# Patient Record
Sex: Female | Born: 1995 | Hispanic: No | Marital: Married | Smoking: Never smoker
Health system: Southern US, Community
[De-identification: ages and names within clinical notes are randomized; demographics above are authoritative.]

## PROBLEM LIST (undated history)

## (undated) DIAGNOSIS — J45909 Unspecified asthma, uncomplicated: Secondary | ICD-10-CM

## (undated) HISTORY — DX: Unspecified asthma, uncomplicated: J45.909

---

## 2014-08-21 ENCOUNTER — Ambulatory Visit (INDEPENDENT_AMBULATORY_CARE_PROVIDER_SITE_OTHER): Payer: Medicaid Other | Admitting: Obstetrics and Gynecology

## 2014-08-21 ENCOUNTER — Encounter: Payer: Self-pay | Admitting: Obstetrics and Gynecology

## 2014-08-21 VITALS — BP 98/80 | HR 102 | Temp 98.0°F | Ht 65.0 in | Wt 137.1 lb

## 2014-08-21 DIAGNOSIS — Z1151 Encounter for screening for human papillomavirus (HPV): Secondary | ICD-10-CM

## 2014-08-21 DIAGNOSIS — Z3492 Encounter for supervision of normal pregnancy, unspecified, second trimester: Secondary | ICD-10-CM

## 2014-08-21 DIAGNOSIS — Z124 Encounter for screening for malignant neoplasm of cervix: Secondary | ICD-10-CM

## 2014-08-21 DIAGNOSIS — Z3402 Encounter for supervision of normal first pregnancy, second trimester: Secondary | ICD-10-CM

## 2014-08-21 DIAGNOSIS — Z3400 Encounter for supervision of normal first pregnancy, unspecified trimester: Secondary | ICD-10-CM | POA: Insufficient documentation

## 2014-08-21 DIAGNOSIS — Z23 Encounter for immunization: Secondary | ICD-10-CM

## 2014-08-21 LAB — POCT URINALYSIS DIP (DEVICE)
BILIRUBIN URINE: NEGATIVE
Glucose, UA: NEGATIVE mg/dL
HGB URINE DIPSTICK: NEGATIVE
Ketones, ur: NEGATIVE mg/dL
NITRITE: NEGATIVE
PH: 7 (ref 5.0–8.0)
PROTEIN: NEGATIVE mg/dL
SPECIFIC GRAVITY, URINE: 1.02 (ref 1.005–1.030)
UROBILINOGEN UA: 0.2 mg/dL (ref 0.0–1.0)

## 2014-08-21 NOTE — Progress Notes (Signed)
Nutrition note: 1st visit consult Pt has gained 7.1# @ 21w, which is < expected. Pt reports eating 3 meals & 4-5 snacks/d. Pt is not taking a PNV yet. Pt reports no N/V or heartburn. NKFA. Pt received verbal & written education on general nutrition during pregnancy. Encouraged PNV or 2 chewable multivitamins. Encouraged protein source with all meals & snacks. Discussed wt gain goals of 25-35# or 1#/wk. Pt agrees to start taking a PNV or equivalent. Pt does not have WIC. Pt plans to BF. F/u as needed Blondell RevealLaura Freddy Spadafora, MS, RD, LDN, [facility] PaBCLC

## 2014-08-21 NOTE — Progress Notes (Signed)
Initial OB appointment

## 2014-08-21 NOTE — Patient Instructions (Signed)
Second Trimester of Pregnancy The second trimester is from week 13 through week 28, months 4 through 6. The second trimester is often a time when you feel your best. Your body has also adjusted to being pregnant, and you begin to feel better physically. Usually, morning sickness has lessened or quit completely, you may have more energy, and you may have an increase in appetite. The second trimester is also a time when the fetus is growing rapidly. At the end of the sixth month, the fetus is about 9 inches long and weighs about 1 pounds. You will likely begin to feel the baby move (quickening) between 18 and 20 weeks of the pregnancy. BODY CHANGES Your body goes through many changes during pregnancy. The changes vary from woman to woman.   Your weight will continue to increase. You will notice your lower abdomen bulging out.  You may begin to get stretch marks on your hips, abdomen, and breasts.  You may develop headaches that can be relieved by medicines approved by your health care provider.  You may urinate more often because the fetus is pressing on your bladder.  You may develop or continue to have heartburn as a result of your pregnancy.  You may develop constipation because certain hormones are causing the muscles that push waste through your intestines to slow down.  You may develop hemorrhoids or swollen, bulging veins (varicose veins).  You may have back pain because of the weight gain and pregnancy hormones relaxing your joints between the bones in your pelvis and as a result of a shift in weight and the muscles that support your balance.  Your breasts will continue to grow and be tender.  Your gums may bleed and may be sensitive to brushing and flossing.  Dark spots or blotches (chloasma, mask of pregnancy) may develop on your face. This will likely fade after the baby is born.  A dark line from your belly button to the pubic area (linea nigra) may appear. This will likely  fade after the baby is born.  You may have changes in your hair. These can include thickening of your hair, rapid growth, and changes in texture. Some women also have hair loss during or after pregnancy, or hair that feels dry or thin. Your hair will most likely return to normal after your baby is born. WHAT TO EXPECT AT YOUR PRENATAL VISITS During a routine prenatal visit:  You will be weighed to make sure you and the fetus are growing normally.  Your blood pressure will be taken.  Your abdomen will be measured to track your baby's growth.  The fetal heartbeat will be listened to.  Any test results from the previous visit will be discussed. Your health care provider may ask you:  How you are feeling.  If you are feeling the baby move.  If you have had any abnormal symptoms, such as leaking fluid, bleeding, severe headaches, or abdominal cramping.  If you have any questions. Other tests that may be performed during your second trimester include:  Blood tests that check for:  Low iron levels (anemia).  Gestational diabetes (between 24 and 28 weeks).  Rh antibodies.  Urine tests to check for infections, diabetes, or protein in the urine.  An ultrasound to confirm the proper growth and development of the baby.  An amniocentesis to check for possible genetic problems.  Fetal screens for spina bifida and Down syndrome. HOME CARE INSTRUCTIONS   Avoid all smoking, herbs, alcohol, and unprescribed   drugs. These chemicals affect the formation and growth of the baby.  Follow your health care provider's instructions regarding medicine use. There are medicines that are either safe or unsafe to take during pregnancy.  Exercise only as directed by your health care provider. Experiencing uterine cramps is a good sign to stop exercising.  Continue to eat regular, healthy meals.  Wear a good support bra for breast tenderness.  Do not use hot tubs, steam rooms, or saunas.  Wear  your seat belt at all times when driving.  Avoid raw meat, uncooked cheese, cat litter boxes, and soil used by cats. These carry germs that can cause birth defects in the baby.  Take your prenatal vitamins.  Try taking a stool softener (if your health care provider approves) if you develop constipation. Eat more high-fiber foods, such as fresh vegetables or fruit and whole grains. Drink plenty of fluids to keep your urine clear or pale yellow.  Take warm sitz baths to soothe any pain or discomfort caused by hemorrhoids. Use hemorrhoid cream if your health care provider approves.  If you develop varicose veins, wear support hose. Elevate your feet for 15 minutes, 3-4 times a day. Limit salt in your diet.  Avoid heavy lifting, wear low heel shoes, and practice good posture.  Rest with your legs elevated if you have leg cramps or low back pain.  Visit your dentist if you have not gone yet during your pregnancy. Use a soft toothbrush to brush your teeth and be gentle when you floss.  A sexual relationship may be continued unless your health care provider directs you otherwise.  Continue to go to all your prenatal visits as directed by your health care provider. SEEK MEDICAL CARE IF:   You have dizziness.  You have mild pelvic cramps, pelvic pressure, or nagging pain in the abdominal area.  You have persistent nausea, vomiting, or diarrhea.  You have a bad smelling vaginal discharge.  You have pain with urination. SEEK IMMEDIATE MEDICAL CARE IF:   You have a fever.  You are leaking fluid from your vagina.  You have spotting or bleeding from your vagina.  You have severe abdominal cramping or pain.  You have rapid weight gain or loss.  You have shortness of breath with chest pain.  You notice sudden or extreme swelling of your face, hands, ankles, feet, or legs.  You have not felt your baby move in over an hour.  You have severe headaches that do not go away with  medicine.  You have vision changes. Document Released: 06/10/2001 Document Revised: 06/21/2013 Document Reviewed: 08/17/2012 [employer] Patient Information 2015 [employer]. This information is not intended to replace advice given to you by your health care provider. Make sure you discuss any questions you have with your health care provider.  Contraception Choices Contraception (birth control) is the use of any methods or devices to prevent pregnancy. Below are some methods to help avoid pregnancy. HORMONAL METHODS   Contraceptive implant. This is a thin, plastic tube containing progesterone hormone. It does not contain estrogen hormone. Your health care provider inserts the tube in the inner part of the upper arm. The tube can remain in place for up to 3 years. After 3 years, the implant must be removed. The implant prevents the ovaries from releasing an egg (ovulation), thickens the cervical mucus to prevent sperm from entering the uterus, and thins the lining of the inside of the uterus.  Progesterone-only injections. These injections are given   every 3 months by your health care provider to prevent pregnancy. This synthetic progesterone hormone stops the ovaries from releasing eggs. It also thickens cervical mucus and changes the uterine lining. This makes it harder for sperm to survive in the uterus.  Birth control pills. These pills contain estrogen and progesterone hormone. They work by preventing the ovaries from releasing eggs (ovulation). They also cause the cervical mucus to thicken, preventing the sperm from entering the uterus. Birth control pills are prescribed by a health care provider.Birth control pills can also be used to treat heavy periods.  Minipill. This type of birth control pill contains only the progesterone hormone. They are taken every day of each month and must be prescribed by your health care provider.  Birth control patch. The patch contains hormones similar to  those in birth control pills. It must be changed once a week and is prescribed by a health care provider.  Vaginal ring. The ring contains hormones similar to those in birth control pills. It is left in the vagina for 3 weeks, removed for 1 week, and then a new one is put back in place. The patient must be comfortable inserting and removing the ring from the vagina.A health care provider's prescription is necessary.  Emergency contraception. Emergency contraceptives prevent pregnancy after unprotected sexual intercourse. This pill can be taken right after sex or up to 5 days after unprotected sex. It is most effective the sooner you take the pills after having sexual intercourse. Most emergency contraceptive pills are available without a prescription. Check with your pharmacist. Do not use emergency contraception as your only form of birth control. BARRIER METHODS   Female condom. This is a thin sheath (latex or rubber) that is worn over the penis during sexual intercourse. It can be used with spermicide to increase effectiveness.  Female condom. This is a soft, loose-fitting sheath that is put into the vagina before sexual intercourse.  Diaphragm. This is a soft, latex, dome-shaped barrier that must be fitted by a health care provider. It is inserted into the vagina, along with a spermicidal jelly. It is inserted before intercourse. The diaphragm should be left in the vagina for 6 to 8 hours after intercourse.  Cervical cap. This is a round, soft, latex or plastic cup that fits over the cervix and must be fitted by a health care provider. The cap can be left in place for up to 48 hours after intercourse.  Sponge. This is a soft, circular piece of polyurethane foam. The sponge has spermicide in it. It is inserted into the vagina after wetting it and before sexual intercourse.  Spermicides. These are chemicals that kill or block sperm from entering the cervix and uterus. They come in the form of  creams, jellies, suppositories, foam, or tablets. They do not require a prescription. They are inserted into the vagina with an applicator before having sexual intercourse. The process must be repeated every time you have sexual intercourse. INTRAUTERINE CONTRACEPTION  Intrauterine device (IUD). This is a T-shaped device that is put in a woman's uterus during a menstrual period to prevent pregnancy. There are 2 types:  Copper IUD. This type of IUD is wrapped in copper wire and is placed inside the uterus. Copper makes the uterus and fallopian tubes produce a fluid that kills sperm. It can stay in place for 10 years.  Hormone IUD. This type of IUD contains the hormone progestin (synthetic progesterone). The hormone thickens the cervical mucus and prevents sperm from   entering the uterus, and it also thins the uterine lining to prevent implantation of a fertilized egg. The hormone can weaken or kill the sperm that get into the uterus. It can stay in place for 3-5 years, depending on which type of IUD is used. PERMANENT METHODS OF CONTRACEPTION  Female tubal ligation. This is when the woman's fallopian tubes are surgically sealed, tied, or blocked to prevent the egg from traveling to the uterus.  Hysteroscopic sterilization. This involves placing a small coil or insert into each fallopian tube. Your doctor uses a technique called hysteroscopy to do the procedure. The device causes scar tissue to form. This results in permanent blockage of the fallopian tubes, so the sperm cannot fertilize the egg. It takes about 3 months after the procedure for the tubes to become blocked. You must use another form of birth control for these 3 months.  Female sterilization. This is when the female has the tubes that carry sperm tied off (vasectomy).This blocks sperm from entering the vagina during sexual intercourse. After the procedure, the man can still ejaculate fluid (semen). NATURAL PLANNING METHODS  Natural family  planning. This is not having sexual intercourse or using a barrier method (condom, diaphragm, cervical cap) on days the woman could become pregnant.  Calendar method. This is keeping track of the length of each menstrual cycle and identifying when you are fertile.  Ovulation method. This is avoiding sexual intercourse during ovulation.  Symptothermal method. This is avoiding sexual intercourse during ovulation, using a thermometer and ovulation symptoms.  Post-ovulation method. This is timing sexual intercourse after you have ovulated. Regardless of which type or method of contraception you choose, it is important that you use condoms to protect against the transmission of sexually transmitted infections (STIs). Talk with your health care provider about which form of contraception is most appropriate for you. Document Released: 06/16/2005 Document Revised: 06/21/2013 Document Reviewed: 12/09/2012 [employer] Patient Information 2015 [employer]. This information is not intended to replace advice given to you by your health care provider. Make sure you discuss any questions you have with your health care provider.  Breastfeeding Deciding to breastfeed is one of the best choices you can make for you and your baby. A change in hormones during pregnancy causes your breast tissue to grow and increases the number and size of your milk ducts. These hormones also allow proteins, sugars, and fats from your blood supply to make breast milk in your milk-producing glands. Hormones prevent breast milk from being released before your baby is born as well as prompt milk flow after birth. Once breastfeeding has begun, thoughts of your baby, as well as his or her sucking or crying, can stimulate the release of milk from your milk-producing glands.  BENEFITS OF BREASTFEEDING For Your Baby  Your first milk (colostrum) helps your baby's digestive system function better.   There are antibodies in your milk that  help your baby fight off infections.   Your baby has a lower incidence of asthma, allergies, and sudden infant death syndrome.   The nutrients in breast milk are better for your baby than infant formulas and are designed uniquely for your baby's needs.   Breast milk improves your baby's brain development.   Your baby is less likely to develop other conditions, such as childhood obesity, asthma, or type 2 diabetes mellitus.  For You   Breastfeeding helps to create a very special bond between you and your baby.   Breastfeeding is convenient. Breast milk is   always available at the correct temperature and costs nothing.   Breastfeeding helps to burn calories and helps you lose the weight gained during pregnancy.   Breastfeeding makes your uterus contract to its prepregnancy size faster and slows bleeding (lochia) after you give birth.   Breastfeeding helps to lower your risk of developing type 2 diabetes mellitus, osteoporosis, and breast or ovarian cancer later in life. SIGNS THAT YOUR BABY IS HUNGRY Early Signs of Hunger  Increased alertness or activity.  Stretching.  Movement of the head from side to side.  Movement of the head and opening of the mouth when the corner of the mouth or cheek is stroked (rooting).  Increased sucking sounds, smacking lips, cooing, sighing, or squeaking.  Hand-to-mouth movements.  Increased sucking of fingers or hands. Late Signs of Hunger  Fussing.  Intermittent crying. Extreme Signs of Hunger Signs of extreme hunger will require calming and consoling before your baby will be able to breastfeed successfully. Do not wait for the following signs of extreme hunger to occur before you initiate breastfeeding:   Restlessness.  A loud, strong cry.   Screaming. BREASTFEEDING BASICS Breastfeeding Initiation  Find a comfortable place to sit or lie down, with your neck and back well supported.  Place a pillow or rolled up blanket  under your baby to bring him or her to the level of your breast (if you are seated). Nursing pillows are specially designed to help support your arms and your baby while you breastfeed.  Make sure that your baby's abdomen is facing your abdomen.   Gently massage your breast. With your fingertips, massage from your chest wall toward your nipple in a circular motion. This encourages milk flow. You may need to continue this action during the feeding if your milk flows slowly.  Support your breast with 4 fingers underneath and your thumb above your nipple. Make sure your fingers are well away from your nipple and your baby's mouth.   Stroke your baby's lips gently with your finger or nipple.   When your baby's mouth is open wide enough, quickly bring your baby to your breast, placing your entire nipple and as much of the colored area around your nipple (areola) as possible into your baby's mouth.   More areola should be visible above your baby's upper lip than below the lower lip.   Your baby's tongue should be between his or her lower gum and your breast.   Ensure that your baby's mouth is correctly positioned around your nipple (latched). Your baby's lips should create a seal on your breast and be turned out (everted).  It is common for your baby to suck about 2-3 minutes in order to start the flow of breast milk. Latching Teaching your baby how to latch on to your breast properly is very important. An improper latch can cause nipple pain and decreased milk supply for you and poor weight gain in your baby. Also, if your baby is not latched onto your nipple properly, he or she may swallow some air during feeding. This can make your baby fussy. Burping your baby when you switch breasts during the feeding can help to get rid of the air. However, teaching your baby to latch on properly is still the best way to prevent fussiness from swallowing air while breastfeeding. Signs that your baby has  successfully latched on to your nipple:    Silent tugging or silent sucking, without causing you pain.   Swallowing heard between every 3-4   sucks.    Muscle movement above and in front of his or her ears while sucking.  Signs that your baby has not successfully latched on to nipple:   Sucking sounds or smacking sounds from your baby while breastfeeding.  Nipple pain. If you think your baby has not latched on correctly, slip your finger into the corner of your baby's mouth to break the suction and place it between your baby's gums. Attempt breastfeeding initiation again. Signs of Successful Breastfeeding Signs from your baby:   A gradual decrease in the number of sucks or complete cessation of sucking.   Falling asleep.   Relaxation of his or her body.   Retention of a small amount of milk in his or her mouth.   Letting go of your breast by himself or herself. Signs from you:  Breasts that have increased in firmness, weight, and size 1-3 hours after feeding.   Breasts that are softer immediately after breastfeeding.  Increased milk volume, as well as a change in milk consistency and color by the fifth day of breastfeeding.   Nipples that are not sore, cracked, or bleeding. Signs That Your Baby is Getting Enough Milk  Wetting at least 3 diapers in a 24-hour period. The urine should be clear and pale yellow by age 5 days.  At least 3 stools in a 24-hour period by age 5 days. The stool should be soft and yellow.  At least 3 stools in a 24-hour period by age 7 days. The stool should be seedy and yellow.  No loss of weight greater than 10% of birth weight during the first 3 days of age.  Average weight gain of 4-7 ounces (113-198 g) per week after age 4 days.  Consistent daily weight gain by age 5 days, without weight loss after the age of 2 weeks. After a feeding, your baby may spit up a small amount. This is common. BREASTFEEDING FREQUENCY AND DURATION Frequent  feeding will help you make more milk and can prevent sore nipples and breast engorgement. Breastfeed when you feel the need to reduce the fullness of your breasts or when your baby shows signs of hunger. This is called "breastfeeding on demand." Avoid introducing a pacifier to your baby while you are working to establish breastfeeding (the first 4-6 weeks after your baby is born). After this time you may choose to use a pacifier. Research has shown that pacifier use during the first year of a baby's life decreases the risk of sudden infant death syndrome (SIDS). Allow your baby to feed on each breast as long as he or she wants. Breastfeed until your baby is finished feeding. When your baby unlatches or falls asleep while feeding from the first breast, offer the second breast. Because newborns are often sleepy in the first few weeks of life, you may need to awaken your baby to get him or her to feed. Breastfeeding times will vary from baby to baby. However, the following rules can serve as a guide to help you ensure that your baby is properly fed:  Newborns (babies 4 weeks of age or younger) may breastfeed every 1-3 hours.  Newborns should not go longer than 3 hours during the day or 5 hours during the night without breastfeeding.  You should breastfeed your baby a minimum of 8 times in a 24-hour period until you begin to introduce solid foods to your baby at around 6 months of age. BREAST MILK PUMPING Pumping and storing breast milk allows   you to ensure that your baby is exclusively fed your breast milk, even at times when you are unable to breastfeed. This is especially important if you are going back to work while you are still breastfeeding or when you are not able to be present during feedings. Your lactation consultant can give you guidelines on how long it is safe to store breast milk.  A breast pump is a machine that allows you to pump milk from your breast into a sterile bottle. The pumped breast  milk can then be stored in a refrigerator or freezer. Some breast pumps are operated by hand, while others use electricity. Ask your lactation consultant which type will work best for you. Breast pumps can be purchased, but some hospitals and breastfeeding support groups lease breast pumps on a monthly basis. A lactation consultant can teach you how to hand express breast milk, if you prefer not to use a pump.  CARING FOR YOUR BREASTS WHILE YOU BREASTFEED Nipples can become dry, cracked, and sore while breastfeeding. The following recommendations can help keep your breasts moisturized and healthy:  Avoid using soap on your nipples.   Wear a supportive bra. Although not required, special nursing bras and tank tops are designed to allow access to your breasts for breastfeeding without taking off your entire bra or top. Avoid wearing underwire-style bras or extremely tight bras.  Air dry your nipples for 3-4minutes after each feeding.   Use only cotton bra pads to absorb leaked breast milk. Leaking of breast milk between feedings is normal.   Use lanolin on your nipples after breastfeeding. Lanolin helps to maintain your skin's normal moisture barrier. If you use pure lanolin, you do not need to wash it off before feeding your baby again. Pure lanolin is not toxic to your baby. You may also hand express a few drops of breast milk and gently massage that milk into your nipples and allow the milk to air dry. In the first few weeks after giving birth, some women experience extremely full breasts (engorgement). Engorgement can make your breasts feel heavy, warm, and tender to the touch. Engorgement peaks within 3-5 days after you give birth. The following recommendations can help ease engorgement:  Completely empty your breasts while breastfeeding or pumping. You may want to start by applying warm, moist heat (in the shower or with warm water-soaked hand towels) just before feeding or pumping. This  increases circulation and helps the milk flow. If your baby does not completely empty your breasts while breastfeeding, pump any extra milk after he or she is finished.  Wear a snug bra (nursing or regular) or tank top for 1-2 days to signal your body to slightly decrease milk production.  Apply ice packs to your breasts, unless this is too uncomfortable for you.  Make sure that your baby is latched on and positioned properly while breastfeeding. If engorgement persists after 48 hours of following these recommendations, contact your health care provider or a lactation consultant. OVERALL HEALTH CARE RECOMMENDATIONS WHILE BREASTFEEDING  Eat healthy foods. Alternate between meals and snacks, eating 3 of each per day. Because what you eat affects your breast milk, some of the foods may make your baby more irritable than usual. Avoid eating these foods if you are sure that they are negatively affecting your baby.  Drink milk, fruit juice, and water to satisfy your thirst (about 10 glasses a day).   Rest often, relax, and continue to take your prenatal vitamins to prevent fatigue,   stress, and anemia.  Continue breast self-awareness checks.  Avoid chewing and smoking tobacco.  Avoid alcohol and drug use. Some medicines that may be harmful to your baby can pass through breast milk. It is important to ask your health care provider before taking any medicine, including all over-the-counter and prescription medicine as well as vitamin and herbal supplements. It is possible to become pregnant while breastfeeding. If birth control is desired, ask your health care provider about options that will be safe for your baby. SEEK MEDICAL CARE IF:   You feel like you want to stop breastfeeding or have become frustrated with breastfeeding.  You have painful breasts or nipples.  Your nipples are cracked or bleeding.  Your breasts are red, tender, or warm.  You have a swollen area on either breast.  You  have a fever or chills.  You have nausea or vomiting.  You have drainage other than breast milk from your nipples.  Your breasts do not become full before feedings by the fifth day after you give birth.  You feel sad and depressed.  Your baby is too sleepy to eat well.  Your baby is having trouble sleeping.   Your baby is wetting less than 3 diapers in a 24-hour period.  Your baby has less than 3 stools in a 24-hour period.  Your baby's skin or the white part of his or her eyes becomes yellow.   Your baby is not gaining weight by 5 days of age. SEEK IMMEDIATE MEDICAL CARE IF:   Your baby is overly tired (lethargic) and does not want to wake up and feed.  Your baby develops an unexplained fever. Document Released: 06/16/2005 Document Revised: 06/21/2013 Document Reviewed: 12/08/2012 ExitCare Patient Information 2015 [employer]. This information is not intended to replace advice given to you by your health care provider. Make sure you discuss any questions you have with your health care provider.  

## 2014-08-21 NOTE — Progress Notes (Signed)
   Subjective:    Jessica LombardFatima ZOXWRUESiddika is a G1P0 [id number] being seen today for her first obstetrical visit.  Her obstetrical history is significant for asthma and first pregnancy. Patient does intend to breast feed. Pregnancy history fully reviewed.  Patient reports no complaints.  Filed Vitals:   08/21/14 0919 08/21/14 0921  BP: 98/80   Pulse: 102   Temp: 98 F (36.7 C)   Height:  5\' 5"  (1.651 m)  Weight: 137 lb 1.6 oz (62.188 kg)     HISTORY: OB History  Gravida Para Term Preterm AB SAB TAB Ectopic Multiple Living  1             # Outcome Date GA Lbr Len/2nd Weight Sex Delivery Anes PTL Lv  1 Current              Past Medical History  Diagnosis Date  . Asthma    History reviewed. No pertinent past surgical history. Family History  Problem Relation Age of Onset  . Diabetes Mother   . Hyperlipidemia Mother   . Hypertension Mother   . Diabetes Father   . Hyperlipidemia Father   . Hypertension Father   . Diabetes Brother      Exam    Uterus:     Pelvic Exam:    Perineum: Normal Perineum   Vulva: normal   Vagina:  normal mucosa, normal discharge   pH:    Cervix: closed and long   Adnexa: not evaluated   Bony Pelvis: gynecoid  System: Breast:  normal appearance, no masses or tenderness   Skin: normal coloration and turgor, no rashes    Neurologic: oriented, no focal deficits   Extremities: normal strength, tone, and muscle mass   HEENT extra ocular movement intact   Mouth/Teeth mucous membranes moist, pharynx normal without lesions, normal dentition   Neck supple and no masses   Cardiovascular: regular rate and rhythm   Respiratory:  chest clear, no wheezing, crepitations, rhonchi, normal symmetric air entry   Abdomen: soft, gravid, NT   Urinary:       Assessment:    Pregnancy: G1P0 Patient Active Problem List   Diagnosis Date Noted  . Supervision of normal first pregnancy, antepartum 08/21/2014        Plan:     Initial labs drawn. Prenatal  vitamins. Problem list reviewed and updated. Genetic Screening discussed Quad Screen: requested.  Ultrasound discussed; fetal survey: ordered.  Follow up in 4 weeks. 50% of 30 min visit spent on counseling and coordination of care.     Corderro Koloski 08/21/2014

## 2014-08-21 NOTE — Progress Notes (Signed)
Anatomy U/S 08/24/14 @ 2p with Radiology (first available).

## 2014-08-22 ENCOUNTER — Encounter: Payer: Self-pay | Admitting: *Deleted

## 2014-08-22 LAB — PRENATAL PROFILE (SOLSTAS)
ANTIBODY SCREEN: NEGATIVE
BASOS PCT: 0 % (ref 0–1)
Basophils Absolute: 0 10*3/uL (ref 0.0–0.1)
EOS ABS: 0.5 10*3/uL (ref 0.0–0.7)
EOS PCT: 3 % (ref 0–5)
HCT: 30.4 % — ABNORMAL LOW (ref 36.0–46.0)
HEP B S AG: NEGATIVE
HIV 1&2 Ab, 4th Generation: NONREACTIVE
Hemoglobin: 9.7 g/dL — ABNORMAL LOW (ref 12.0–15.0)
LYMPHS ABS: 2.4 10*3/uL (ref 0.7–4.0)
LYMPHS PCT: 15 % (ref 12–46)
MCH: 26.4 pg (ref 26.0–34.0)
MCHC: 31.9 g/dL (ref 30.0–36.0)
MCV: 82.8 fL (ref 78.0–100.0)
MONO ABS: 1.6 10*3/uL — AB (ref 0.1–1.0)
MONOS PCT: 10 % (ref 3–12)
MPV: 10.4 fL (ref 8.6–12.4)
NEUTROS ABS: 11.7 10*3/uL — AB (ref 1.7–7.7)
Neutrophils Relative %: 72 % (ref 43–77)
PLATELETS: 303 10*3/uL (ref 150–400)
RBC: 3.67 MIL/uL — ABNORMAL LOW (ref 3.87–5.11)
RDW: 14.6 % (ref 11.5–15.5)
RH TYPE: NEGATIVE
RUBELLA: 16.5 {index} — AB (ref <0.90)
WBC: 16.3 10*3/uL — ABNORMAL HIGH (ref 4.0–10.5)

## 2014-08-22 LAB — CULTURE, OB URINE

## 2014-08-22 LAB — AFP, QUAD SCREEN
AFP: 134.8 ng/mL
CURR GEST AGE: 23 wks.days
HCG TOTAL: 14.12 [IU]/mL
INH: 577.2 pg/mL
Interpretation-AFP: NEGATIVE
MoM for AFP: 1.46
MoM for INH: 2.18
MoM for hCG: 0.71
Open Spina bifida: NEGATIVE
Osb Risk: 1:3120 {titer}
Tri 18 Scr Risk Est: NEGATIVE
Trisomy 18 (Edward) Syndrome Interp.: 1:35000 {titer}
UE3 VALUE: 2.38 ng/mL
uE3 Mom: 0.82

## 2014-08-22 LAB — GLUCOSE TOLERANCE, 1 HOUR (50G) W/O FASTING: Glucose, 1 Hour GTT: 103 mg/dL (ref 70–140)

## 2014-08-23 LAB — PRESCRIPTION MONITORING PROFILE (19 PANEL)
AMPHETAMINE/METH: NEGATIVE ng/mL
BUPRENORPHINE, URINE: NEGATIVE ng/mL
Barbiturate Screen, Urine: NEGATIVE ng/mL
Benzodiazepine Screen, Urine: NEGATIVE ng/mL
CANNABINOID SCRN UR: NEGATIVE ng/mL
CARISOPRODOL, URINE: NEGATIVE ng/mL
COCAINE METABOLITES: NEGATIVE ng/mL
Creatinine, Urine: 81.83 mg/dL (ref >20.0)
Fentanyl, Ur: NEGATIVE ng/mL
MDMA URINE: NEGATIVE ng/mL
METHADONE SCREEN, URINE: NEGATIVE ng/mL
METHAQUALONE SCREEN (URINE): NEGATIVE ng/mL
Meperidine, Ur: NEGATIVE ng/mL
Nitrites, Initial: NEGATIVE ug/mL
Opiate Screen, Urine: NEGATIVE ng/mL
Oxycodone Screen, Ur: NEGATIVE ng/mL
Phencyclidine, Ur: NEGATIVE ng/mL
Propoxyphene: NEGATIVE ng/mL
TAPENTADOLUR: NEGATIVE ng/mL
Tramadol Scrn, Ur: NEGATIVE ng/mL
Zolpidem, Urine: NEGATIVE ng/mL
pH, Initial: 7.4 pH (ref 4.5–8.9)

## 2014-08-23 LAB — HEMOGLOBINOPATHY EVALUATION
HGB F QUANT: 0 % (ref 0.0–2.0)
HGB S QUANTITAION: 0 %
Hemoglobin Other: 0 %
Hgb A2 Quant: 2.4 % (ref 2.2–3.2)
Hgb A: 97.6 % (ref 96.8–97.8)

## 2014-08-23 LAB — CYTOLOGY - PAP

## 2014-08-24 ENCOUNTER — Encounter: Payer: Self-pay | Admitting: Obstetrics and Gynecology

## 2014-08-24 ENCOUNTER — Other Ambulatory Visit (HOSPITAL_COMMUNITY): Payer: Self-pay

## 2014-08-24 DIAGNOSIS — O26899 Other specified pregnancy related conditions, unspecified trimester: Secondary | ICD-10-CM

## 2014-08-24 DIAGNOSIS — Z6791 Unspecified blood type, Rh negative: Secondary | ICD-10-CM | POA: Insufficient documentation

## 2014-08-25 ENCOUNTER — Ambulatory Visit (HOSPITAL_COMMUNITY): Payer: Self-pay

## 2014-09-01 ENCOUNTER — Ambulatory Visit (HOSPITAL_COMMUNITY)
Admission: RE | Admit: 2014-09-01 | Discharge: 2014-09-01 | Disposition: A | Discharge status: Home / Self Care | Payer: Medicaid Other | Source: Ambulatory Visit | Attending: Obstetrics and Gynecology | Admitting: Obstetrics and Gynecology

## 2014-09-01 DIAGNOSIS — O0932 Supervision of pregnancy with insufficient antenatal care, second trimester: Secondary | ICD-10-CM | POA: Insufficient documentation

## 2014-09-01 DIAGNOSIS — Z3689 Encounter for other specified antenatal screening: Secondary | ICD-10-CM | POA: Insufficient documentation

## 2014-09-01 DIAGNOSIS — Z3A24 24 weeks gestation of pregnancy: Secondary | ICD-10-CM | POA: Insufficient documentation

## 2014-09-01 DIAGNOSIS — Z36 Encounter for antenatal screening of mother: Secondary | ICD-10-CM | POA: Insufficient documentation

## 2014-09-01 DIAGNOSIS — Z3402 Encounter for supervision of normal first pregnancy, second trimester: Secondary | ICD-10-CM

## 2014-09-19 ENCOUNTER — Encounter: Payer: Self-pay | Admitting: Physician Assistant

## 2014-09-19 ENCOUNTER — Ambulatory Visit (INDEPENDENT_AMBULATORY_CARE_PROVIDER_SITE_OTHER): Payer: Medicaid Other | Admitting: Physician Assistant

## 2014-09-19 VITALS — BP 111/61 | HR 94 | Wt 143.3 lb

## 2014-09-19 DIAGNOSIS — Z3492 Encounter for supervision of normal pregnancy, unspecified, second trimester: Secondary | ICD-10-CM

## 2014-09-19 LAB — POCT URINALYSIS DIP (DEVICE)
BILIRUBIN URINE: NEGATIVE
Glucose, UA: NEGATIVE mg/dL
Hgb urine dipstick: NEGATIVE
KETONES UR: NEGATIVE mg/dL
Nitrite: NEGATIVE
PROTEIN: NEGATIVE mg/dL
Specific Gravity, Urine: 1.015 (ref 1.005–1.030)
Urobilinogen, UA: 0.2 mg/dL (ref 0.0–1.0)
pH: 6 (ref 5.0–8.0)

## 2014-09-19 NOTE — Patient Instructions (Signed)
Second Trimester of Pregnancy The second trimester is from week 13 through week 28, months 4 through 6. The second trimester is often a time when you feel your best. Your body has also adjusted to being pregnant, and you begin to feel better physically. Usually, morning sickness has lessened or quit completely, you may have more energy, and you may have an increase in appetite. The second trimester is also a time when the fetus is growing rapidly. At the end of the sixth month, the fetus is about 9 inches long and weighs about 1 pounds. You will likely begin to feel the baby move (quickening) between 18 and 20 weeks of the pregnancy. BODY CHANGES Your body goes through many changes during pregnancy. The changes vary from woman to woman.   Your weight will continue to increase. You will notice your lower abdomen bulging out.  You may begin to get stretch marks on your hips, abdomen, and breasts.  You may develop headaches that can be relieved by medicines approved by your health care provider.  You may urinate more often because the fetus is pressing on your bladder.  You may develop or continue to have heartburn as a result of your pregnancy.  You may develop constipation because certain hormones are causing the muscles that push waste through your intestines to slow down.  You may develop hemorrhoids or swollen, bulging veins (varicose veins).  You may have back pain because of the weight gain and pregnancy hormones relaxing your joints between the bones in your pelvis and as a result of a shift in weight and the muscles that support your balance.  Your breasts will continue to grow and be tender.  Your gums may bleed and may be sensitive to brushing and flossing.  Dark spots or blotches (chloasma, mask of pregnancy) may develop on your face. This will likely fade after the baby is born.  A dark line from your belly button to the pubic area (linea nigra) may appear. This will likely fade  after the baby is born.  You may have changes in your hair. These can include thickening of your hair, rapid growth, and changes in texture. Some women also have hair loss during or after pregnancy, or hair that feels dry or thin. Your hair will most likely return to normal after your baby is born. WHAT TO EXPECT AT YOUR PRENATAL VISITS During a routine prenatal visit:  You will be weighed to make sure you and the fetus are growing normally.  Your blood pressure will be taken.  Your abdomen will be measured to track your baby's growth.  The fetal heartbeat will be listened to.  Any test results from the previous visit will be discussed. Your health care provider may ask you:  How you are feeling.  If you are feeling the baby move.  If you have had any abnormal symptoms, such as leaking fluid, bleeding, severe headaches, or abdominal cramping.  If you have any questions. Other tests that may be performed during your second trimester include:  Blood tests that check for:  Low iron levels (anemia).  Gestational diabetes (between 24 and 28 weeks).  Rh antibodies.  Urine tests to check for infections, diabetes, or protein in the urine.  An ultrasound to confirm the proper growth and development of the baby.  An amniocentesis to check for possible genetic problems.  Fetal screens for spina bifida and Down syndrome. HOME CARE INSTRUCTIONS   Avoid all smoking, herbs, alcohol, and unprescribed   drugs. These chemicals affect the formation and growth of the baby.  Follow your health care provider's instructions regarding medicine use. There are medicines that are either safe or unsafe to take during pregnancy.  Exercise only as directed by your health care provider. Experiencing uterine cramps is a good sign to stop exercising.  Continue to eat regular, healthy meals.  Wear a good support bra for breast tenderness.  Do not use hot tubs, steam rooms, or saunas.  Wear your  seat belt at all times when driving.  Avoid raw meat, uncooked cheese, cat litter boxes, and soil used by cats. These carry germs that can cause birth defects in the baby.  Take your prenatal vitamins.  Try taking a stool softener (if your health care provider approves) if you develop constipation. Eat more high-fiber foods, such as fresh vegetables or fruit and whole grains. Drink plenty of fluids to keep your urine clear or pale yellow.  Take warm sitz baths to soothe any pain or discomfort caused by hemorrhoids. Use hemorrhoid cream if your health care provider approves.  If you develop varicose veins, wear support hose. Elevate your feet for 15 minutes, 3-4 times a day. Limit salt in your diet.  Avoid heavy lifting, wear low heel shoes, and practice good posture.  Rest with your legs elevated if you have leg cramps or low back pain.  Visit your dentist if you have not gone yet during your pregnancy. Use a soft toothbrush to brush your teeth and be gentle when you floss.  A sexual relationship may be continued unless your health care provider directs you otherwise.  Continue to go to all your prenatal visits as directed by your health care provider. SEEK MEDICAL CARE IF:   You have dizziness.  You have mild pelvic cramps, pelvic pressure, or nagging pain in the abdominal area.  You have persistent nausea, vomiting, or diarrhea.  You have a bad smelling vaginal discharge.  You have pain with urination. SEEK IMMEDIATE MEDICAL CARE IF:   You have a fever.  You are leaking fluid from your vagina.  You have spotting or bleeding from your vagina.  You have severe abdominal cramping or pain.  You have rapid weight gain or loss.  You have shortness of breath with chest pain.  You notice sudden or extreme swelling of your face, hands, ankles, feet, or legs.  You have not felt your baby move in over an hour.  You have severe headaches that do not go away with  medicine.  You have vision changes. Document Released: 06/10/2001 Document Revised: 06/21/2013 Document Reviewed: 08/17/2012 Exit[employer] Patient Information 2015 [employer]. This information is not intended to replace advice given to you by your health care provider. Make sure you discuss any questions you have with your health care provider.  

## 2014-09-19 NOTE — Progress Notes (Signed)
27 weeks, no complaints.  Denies LOF, dysuria, vag bleeding.  Endorses good fetal movement.   Does not know sex of baby and does not wish to know Planning to move to [place] next Thursday.  Will need PNC there asap. RTC 1 week for 28 week visit.

## 2014-09-25 ENCOUNTER — Other Ambulatory Visit (INDEPENDENT_AMBULATORY_CARE_PROVIDER_SITE_OTHER): Payer: Medicaid Other

## 2014-09-25 DIAGNOSIS — Z3492 Encounter for supervision of normal pregnancy, unspecified, second trimester: Secondary | ICD-10-CM

## 2014-09-25 DIAGNOSIS — Z3403 Encounter for supervision of normal first pregnancy, third trimester: Secondary | ICD-10-CM

## 2014-09-25 DIAGNOSIS — O360930 Maternal care for other rhesus isoimmunization, third trimester, not applicable or unspecified: Secondary | ICD-10-CM

## 2014-09-25 LAB — CBC
HCT: 28.2 % — ABNORMAL LOW (ref 36.0–46.0)
HEMOGLOBIN: 9 g/dL — AB (ref 12.0–15.0)
MCH: 26.2 pg (ref 26.0–34.0)
MCHC: 31.9 g/dL (ref 30.0–36.0)
MCV: 82.2 fL (ref 78.0–100.0)
MPV: 10.2 fL (ref 8.6–12.4)
Platelets: 268 10*3/uL (ref 150–400)
RBC: 3.43 MIL/uL — ABNORMAL LOW (ref 3.87–5.11)
RDW: 14.8 % (ref 11.5–15.5)
WBC: 14.9 10*3/uL — ABNORMAL HIGH (ref 4.0–10.5)

## 2014-09-25 MED ORDER — RHO D IMMUNE GLOBULIN 1500 UNIT/2ML IJ SOSY
300.0000 ug | PREFILLED_SYRINGE | Freq: Once | INTRAMUSCULAR | Status: AC
  Administered 2014-09-25: 300 ug via INTRAMUSCULAR

## 2014-09-25 NOTE — Patient Instructions (Signed)
Rh0 [D] Immune Globulin injection  What is this medicine?  RhO [D] IMMUNE GLOBULIN (i MYOON GLOB yoo lin) is used to treat idiopathic thrombocytopenic purpura (ITP). This medicine is used in RhO negative mothers who are pregnant with a RhO positive child. It is also used after a transfusion of RhO positive blood into a RhO negative person.  This medicine may be used for other purposes; ask your health care provider or pharmacist if you have questions.  COMMON BRAND NAME(S): BayRho-D, HyperRHO S/D, MICRhoGAM, RhoGAM, Rhophylac, WinRho SDF  What should I tell my health care provider before I take this medicine?  They need to know if you have any of these conditions:  -bleeding disorders  -low levels of immunoglobulin A in the body  -no spleen  -an unusual or allergic reaction to human immune globulin, other medicines, foods, dyes, or preservatives  -pregnant or trying to get pregnant  -breast-feeding  How should I use this medicine?  This medicine is for injection into a muscle or into a vein. It is given by a health care professional in a hospital or clinic setting.  Talk to your pediatrician regarding the use of this medicine in children. This medicine is not approved for use in children.  Overdosage: If you think you have taken too much of this medicine contact a poison control center or emergency room at once.  NOTE: This medicine is only for you. Do not share this medicine with others.  What if I miss a dose?  It is important not to miss your dose. Call your doctor or health care professional if you are unable to keep an appointment.  What may interact with this medicine?  -live virus vaccines, like measles, mumps, or rubella  This list may not describe all possible interactions. Give your health care provider a list of all the medicines, herbs, non-prescription drugs, or dietary supplements you use. Also tell them if you smoke, drink alcohol, or use illegal drugs. Some items may interact with your  medicine.  What should I watch for while using this medicine?  This medicine is made from human blood. It may be possible to pass an infection in this medicine. Talk to your doctor about the risks and benefits of this medicine.  This medicine may interfere with live virus vaccines. Before you get live virus vaccines tell your health care professional if you have received this medicine within the past 3 months.  What side effects may I notice from receiving this medicine?  Side effects that you should report to your doctor or health care professional as soon as possible:  -allergic reactions like skin rash, itching or hives, swelling of the face, lips, or tongue  -breathing problems  -chest pain or tightness  -yellowing of the eyes or skin  Side effects that usually do not require medical attention (report to your doctor or health care professional if they continue or are bothersome):  -fever  -pain and tenderness at site where injected  This list may not describe all possible side effects. Call your doctor for medical advice about side effects. You may report side effects to FDA at 1-800-FDA-1088.  Where should I keep my medicine?  This drug is given in a hospital or clinic and will not be stored at home.  NOTE: This sheet is a summary. It may not cover all possible information. If you have questions about this medicine, talk to your doctor, pharmacist, or health care provider.   2015, [employer]. (  2008-02-14 14:06:10)

## 2014-09-26 LAB — GLUCOSE TOLERANCE, 1 HOUR (50G) W/O FASTING: GLUCOSE 1 HOUR GTT: 73 mg/dL (ref 70–140)

## 2014-09-26 LAB — HIV ANTIBODY (ROUTINE TESTING W REFLEX): HIV 1&2 Ab, 4th Generation: NONREACTIVE

## 2014-09-26 LAB — RPR

## 2015-06-26 ENCOUNTER — Encounter (HOSPITAL_COMMUNITY): Payer: Self-pay | Admitting: *Deleted
